# Patient Record
Sex: Female | Born: 1985 | Race: White | Hispanic: No | Marital: Married
Health system: Midwestern US, Academic
[De-identification: ages and names within clinical notes are randomized; demographics above are authoritative.]

---

## 2021-07-12 ENCOUNTER — Encounter: Admit: 2021-07-12 | Discharge: 2021-07-12

## 2021-07-21 ENCOUNTER — Encounter: Admit: 2021-07-21 | Discharge: 2021-07-21 | Payer: BC Managed Care – PPO | Primary: Primary Care

## 2021-07-25 ENCOUNTER — Encounter: Admit: 2021-07-25 | Discharge: 2021-07-25 | Payer: BC Managed Care – PPO | Primary: Primary Care

## 2021-07-25 DIAGNOSIS — Z9229 Personal history of other drug therapy: Secondary | ICD-10-CM

## 2021-07-28 ENCOUNTER — Encounter: Admit: 2021-07-28 | Discharge: 2021-07-28 | Payer: BC Managed Care – PPO | Primary: Primary Care

## 2021-07-28 ENCOUNTER — Ambulatory Visit: Admit: 2021-07-28 | Discharge: 2021-07-29 | Payer: BC Managed Care – PPO | Primary: Primary Care

## 2021-07-28 DIAGNOSIS — Z9229 Personal history of other drug therapy: Secondary | ICD-10-CM

## 2021-07-28 DIAGNOSIS — I82721 Chronic embolism and thrombosis of deep veins of right upper extremity: Secondary | ICD-10-CM

## 2021-07-28 DIAGNOSIS — G540 Brachial plexus disorders: Secondary | ICD-10-CM

## 2021-07-28 NOTE — Progress Notes
Date of Service: 07/28/2021    Anna Oneill is a 36 y.o. female.       Pertinent medical history:  1. Recurrent upper extremity deep vein thrombosis  2. Irritable bowel syndrome  3. Tobacco use    HPI     Anna Oneill is here today for evaluation of upper extremity DVT.  She was referred by her primary care provider Anna Piedra, NP.    She has a thrombosis history as detailed below: (We are unsure of the exact details as records are not available for review but details listed below are what patient recalls and from charts available)  1. November 2021: right arm dvt and PE. xarelto. 6 months then stopped.  She believes thrombophilia testing was completed at this time which was otherwise negative.  2. July 2022: Started to experience symptoms of right upper extremity pain.  Diagnosed with occlusive right upper extremity thrombosis and pulmonary embolism (?old). Restarted on anticoagulation with Xarelto at that time.  3. Repeat right upper extremity ultrasound July 2022 mentions an occlusive thrombus in the basilic vein.  Remaining veins are mentioned to demonstrate normal compressibility and waveforms.  4. October 2022: She started to experience symptoms of her new right upper extremity pain.  Ultrasound showed a partially occlusive right upper extremity subclavian DVT with evidence of collateralization with extension of the thrombus into the basilic and cephalic veins.  Partially occlusive left subclavian vein thrombosis.  No pulmonary embolism noted during this hospitalization.  This thrombotic event appears to have occurred while on Xarelto.  She was admitted in the hospital and started on anticoagulation with heparin and then DC'd on Pradaxa.    She reports symptoms of difficulty with raising her arms above her head due to pain.  She enjoys weight lifting/bodybuilding but has been unable to do so due to significant arm discomfort.  She also reports difficulty with drying her hair and with overhead movements.  She is a Nature conservation officer at [employer] and has had difficulty with her job due to the symptoms.  She is quite frustrated with her symptoms as they have become lifestyle limiting.    She was seen by Dr.Aboujawde with medical oncology at [facility] November 2022.  She was told to continue anticoagulation lifelong due to recurrent venous thrombosis.    She currently smokes cigarettes.    She does have a family history significant for a half brother that passed away from a pulmonary embolism.  She specifically called thrombophilia testing for genetic causes of thrombosis which was negative.  Otherwise no other known family history of thrombosis.         Vitals:    07/28/21 1107   BP: 114/80   BP Source: Leg, Left Upper   Pulse: 68   PainSc: Four   Weight: 81.4 kg (179 lb 6.4 oz)   Height: 170.2 cm (5' 7)     Body mass index is 28.1 kg/m?Marland Kitchen     Past Medical History  Patient Active Problem List    Diagnosis Date Noted   ? 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   HENT: Negative.  Eyes: Negative.    Cardiovascular: Positive for chest pain.   Respiratory: Negative.    Endocrine: Negative.    Hematologic/Lymphatic: Negative.    Skin: Negative.    Musculoskeletal: Negative.    Gastrointestinal: Negative.    Genitourinary: Negative.    Neurological: Negative.    Psychiatric/Behavioral: Negative.    Allergic/Immunologic: Negative.        Physical Exam   Nursing note and vitals reviewed.  Cardiovascular: Normal rate.   Dilated veins over right anterior chest wall   Musculoskeletal:      Cervical back: Normal range of motion and neck supple. No rigidity.      Comments: No significant upper extremity edema Neurological: She is alert and oriented to person, place, and time.   Skin: Skin is warm and dry.           Cardiovascular Studies      Cardiovascular Health Factors  Vitals BP Readings from Last 3 Encounters:   07/28/21 114/80     Wt Readings from Last 3 Encounters:   07/28/21 81.4 kg (179 lb 6.4 oz)     BMI Readings from Last 3 Encounters:   07/28/21 28.10 kg/m?      Smoking Social History     Tobacco Use   Smoking Status Every Day   ? Packs/day: 1.00   ? Types: Cigarettes   Smokeless Tobacco Not on file      Lipid Profile No results found for: CHOL  No results found for: HDL  No results found for: LDL  No results found for: TRIG   Blood Sugar No results found for: HGBA1C  No results found for: GLU, GLUF, GLUPOC       Problems Addressed Today  Encounter Diagnoses   Name Primary?   ? 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 TOS (thoracic outlet syndrome)        Assessment and Plan     [name] is here today for evaluation of upper extremity DVT.  She was first diagnosed with right upper extremity DVT and pulmonary embolism in July 2022.  She was started on anticoagulation with Xarelto.  In October 2022 she reported new symptoms of right upper extremity discomfort while compliant with Xarelto.  Ultrasound showed partially occlusive right upper extremity subclavian DVT with evidence of collateralization with extension of the thrombus into the basilic and cephalic veins and partially occlusive left subclavian vein thrombosis.  She was started on anticoagulation with Pradaxa due to concern for Xarelto failure.    Her symptoms and recurrent thrombosis in the subclavian-axillary distribution, despite adequate anticoagulation, is concerning for venous thoracic outlet syndrome.  Would recommend further evaluation with a venous ultrasound.  We will also place a referral to Dr. Naomie Dean for evaluation for possible thoracic outlet decompression.  We did discuss that she may require a venogram. Unsure that the recurrent thrombosis is related to Xarelto failure but more likely related to mechanical issues from thoracic outlet syndrome.  She is doing well on Pradaxa and can continue that for now.    Thrombophilia testing: She believes thrombophilia testing was completed during the time of her first diagnosis.  We will request these records from [facility] for review.    Continue anticoagulation with Pradaxa.         Current Medications (including today's revisions)  ? dabigatran (PRADAXA) 150 mg capsule Take one capsule by mouth twice daily.   ? dicyclomine (BENTYL) 20 mg  tablet Take one tablet by mouth four times daily as needed.   ? FLUoxetine (PROZAC) 40 mg capsule Take one capsule by mouth daily.   ? linaCLOtide (LINZESS) 145 mcg capsule Take one capsule by mouth daily 30 minutes before breakfast.   ? temazepam (RESTORIL) 30 mg capsule Take one capsule by mouth at bedtime as needed.

## 2021-07-28 NOTE — Patient Instructions
Thank you so much for seeing [place] today! Dr. Trixie Dredge would like for you to do the following,     A Referral has been sent to Dr Naomie Dean. His office will call you to schedule. Please make sure to schedule upper extremity ultrasound on same day (order has been placed and Dr Georgana Curio schedulers can schedule that also)  Follow up with Dr Trixie Dredge in 6 months    It was a pleasure to see you, Please send a Mychart message or call at [phone] if you have any questions. Thank you and have a great day!  It was good to see you today!     Please contact the cardiology [facility] Team at [phone] or send a message through the MyChart system if you have questions or concerns. We will get back to you as soon as possible.     NOTE: MyChart messages and phone calls received on weekends, on holidays, and after 4 pm on weekdays will NOT be seen until the following business day. If you have an urgent matter during these times, please call [phone] to reach the on-call team.     You may receive test results in MyChart before the ordering provider has reviewed them. Our care team will follow up with you after reviewing the tests to discuss your care. This may take up to 5-7 business days if results are not urgently needing to be addressed. Thank you for your patience.      If you need prescription refills, please contact your pharmacy.  Cardiology scheduling number: [phone]

## 2021-09-13 ENCOUNTER — Ambulatory Visit: Admit: 2021-09-13 | Discharge: 2021-09-13 | Payer: BC Managed Care – PPO | Primary: Primary Care

## 2021-09-13 ENCOUNTER — Encounter: Admit: 2021-09-13 | Discharge: 2021-09-13 | Payer: BC Managed Care – PPO | Primary: Primary Care

## 2021-09-13 DIAGNOSIS — Z9229 Personal history of other drug therapy: Secondary | ICD-10-CM

## 2021-09-13 DIAGNOSIS — G540 Brachial plexus disorders: Secondary | ICD-10-CM

## 2021-09-13 DIAGNOSIS — I82721 Chronic embolism and thrombosis of deep veins of right upper extremity: Secondary | ICD-10-CM

## 2021-09-13 DIAGNOSIS — I871 Compression of vein: Secondary | ICD-10-CM

## 2021-09-13 NOTE — Progress Notes
Date of Service: 09/13/2021              Chief Complaint   Patient presents with   ? Deep Vein Thrombosis       History of Present Illness    36 year old woman who is very much into weight lifting who couple years ago developed right upper extremity venous thrombosis.  She was placed on anticoagulation but did not get catheter directed lysis.    She subsequently stopped anticoagulation several months later and then redeveloped a DVT and has been on anticoagulation since.    She has no hypercoagulable history /reports she had a negative work-up in the past with one of her previous DVTs.    Recent duplex suggest partially plosive subclavian vein chronic thrombus.          Medical History:   Diagnosis Date   ? HX: anticoagulation        History reviewed. No pertinent surgical history.    Allergies:  Allergies   Allergen Reactions   ? Promethazine RASH       Medication List:  ? dabigatran (PRADAXA) 150 mg capsule Take one capsule by mouth twice daily.   ? dicyclomine (BENTYL) 20 mg tablet Take one tablet by mouth four times daily as needed.   ? FLUoxetine (PROZAC) 40 mg capsule Take one capsule by mouth daily.   ? linaCLOtide (LINZESS) 145 mcg capsule Take one capsule by mouth daily 30 minutes before breakfast.   ? temazepam (RESTORIL) 30 mg capsule Take one capsule by mouth at bedtime as needed.       Social History:   reports that she has been smoking cigarettes. She has been smoking an average of 1 pack per day. She does not have any smokeless tobacco history on file.    History reviewed. No pertinent family history.    Review of Systems            Vitals:    09/13/21 1424 09/13/21 1426   BP: 124/78 116/76   BP Source: Arm, Right Upper Arm, Left Upper   Pulse: 75 71   Weight: 81.2 kg (179 lb)    Height: 170.2 cm (5' 7)      Body mass index is 28.04 kg/m?Marland Kitchen     Physical Exam  Constitutional:       Appearance: Normal appearance.   HENT:      Head: Normocephalic.      Mouth/Throat:      Pharynx: Oropharynx is clear. Eyes:      Conjunctiva/sclera: Conjunctivae normal.   Cardiovascular:      Rate and Rhythm: Normal rate and regular rhythm.      Heart sounds: Normal heart sounds.      Comments: Loss of right radial pulse with abduction and posterior deflection of the right arm  Pulmonary:      Breath sounds: Normal breath sounds.   Abdominal:      Palpations: Abdomen is soft. There is no mass.   Musculoskeletal:         General: Normal range of motion.      Cervical back: Normal range of motion and neck supple.   Skin:     General: Skin is warm.   Neurological:      General: No focal deficit present.      Mental Status: She is alert. Mental status is at baseline.   Psychiatric:         Mood and Affect: Mood normal.  Thought Content: Thought content normal.         Judgment: Judgment normal.             Assessment and Plan:    1. 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    We discussed the risks benefits alternatives.  I did discuss with her that I doubt her arm will be completely normal completely asymptomatic from handout but studies would suggest that functionally she should improve and with venous thrombectomy and thoracic outlet decompression we could likely take her off coagulation anticoagulation a few months after the surgery.  She wants to proceed with right first rib resection and subclavian vein repair

## 2021-09-14 ENCOUNTER — Encounter: Admit: 2021-09-14 | Discharge: 2021-09-14 | Payer: BC Managed Care – PPO | Primary: Primary Care

## 2021-09-14 ENCOUNTER — Inpatient Hospital Stay: Admit: 2021-09-14 | Discharge: 2021-09-14 | Payer: BC Managed Care – PPO | Primary: Primary Care

## 2021-09-14 DIAGNOSIS — I871 Compression of vein: Secondary | ICD-10-CM

## 2021-09-14 MED ORDER — CEFAZOLIN IN 0.9% SOD CHLORIDE 2 GRAM/110 ML IVPB
2 g | Freq: Once | INTRAVENOUS | 0 refills
Start: 2021-09-14 — End: ?

## 2021-09-15 ENCOUNTER — Encounter: Admit: 2021-09-15 | Discharge: 2021-09-15 | Payer: BC Managed Care – PPO | Primary: Primary Care

## 2021-09-18 ENCOUNTER — Encounter: Admit: 2021-09-18 | Discharge: 2021-09-18 | Payer: BC Managed Care – PPO | Primary: Primary Care

## 2021-09-22 ENCOUNTER — Encounter: Admit: 2021-09-22 | Discharge: 2021-09-22 | Payer: BC Managed Care – PPO | Primary: Primary Care

## 2021-09-22 ENCOUNTER — Ambulatory Visit: Admit: 2021-09-22 | Discharge: 2021-09-22 | Payer: BC Managed Care – PPO | Primary: Primary Care

## 2021-09-22 DIAGNOSIS — I871 Compression of vein: Secondary | ICD-10-CM

## 2021-09-22 DIAGNOSIS — Z9229 Personal history of other drug therapy: Secondary | ICD-10-CM

## 2021-09-22 DIAGNOSIS — Z01818 Encounter for other preprocedural examination: Secondary | ICD-10-CM

## 2021-09-22 DIAGNOSIS — K589 Irritable bowel syndrome without diarrhea: Secondary | ICD-10-CM

## 2021-09-22 DIAGNOSIS — I82721 Chronic embolism and thrombosis of deep veins of right upper extremity: Secondary | ICD-10-CM

## 2021-09-22 LAB — COMPREHENSIVE METABOLIC PANEL
ALBUMIN: 4.6 g/dL (ref 3.5–5.0)
ALK PHOSPHATASE: 51 U/L (ref 25–110)
ALT: 51 U/L (ref 7–56)
ANION GAP: 7 (ref 3–12)
AST: 53 U/L — ABNORMAL HIGH (ref 7–40)
BLD UREA NITROGEN: 16 mg/dL (ref 7–25)
CALCIUM: 9.7 mg/dL (ref 8.5–10.6)
CHLORIDE: 101 MMOL/L (ref 98–110)
CO2: 28 MMOL/L (ref 21–30)
CREATININE: 1 mg/dL — ABNORMAL HIGH (ref 0.4–1.00)
EGFR: >60 mL/min (ref >60)
GLUCOSE,PANEL: 91 mg/dL (ref 70–100)
POTASSIUM: 4.5 MMOL/L (ref 3.5–5.1)
SODIUM: 136 MMOL/L — ABNORMAL LOW (ref 137–147)
TOTAL BILIRUBIN: 1.1 mg/dL (ref 0.3–1.2)
TOTAL PROTEIN: 7.8 g/dL (ref 6.0–8.0)

## 2021-09-22 NOTE — Pre-Anesthesia Patient Instructions
PREPROCEDURE INFORMATION    Arrival at the hospital  [facility]  [address]  [place], [place] [address]    Park in the [employer], located directly across from the main entrance to the hospital.  Enter through the ground floor main hospital entrance and check in at the Information Desk in the lobby.  They will validate your parking ticket and direct you to the next location.  If you are a woman between the ages of 96 and 63, and have not had a hysterectomy, you will be asked for a urine sample prior to surgery.  Please do not urinate before arriving in the Surgery Waiting Room.  Once there, check in and let the attendant know if you need to provide a sample.    You will receive a call with your surgery arrival time between 2:30pm and 4:30pm the last business day before your procedure.  If you do not receive a call, please call [phone] before 4:30pm or [phone] after 4:30pm.  Phone carriers that use spam blockers will sometimes block our phone numbers. If your phone contact number is a mobile phone, please adjust your settings to make sure you receive our call.  In your phone settings, turn OFF the setting ?silence unknown callers.?  Please add these phone numbers to your contacts [phone] & [phone]  Follow any additional instructions from Dr Decamp's office    Eating or drinking before surgery  Do not eat or drink anything after 11:00 p.m. the day before your procedure (including gum, mints, candy, or chewing tobacco) OR follow the specific instructions you were given by your Surgeon.  You may have WATER ONLY up to 2 hours before arriving at the hospital.    Planning transportation for outpatient procedure  For your safety, you will need to arrange for a responsible ride/person to accompany you home due to sedation or anesthesia with your procedure.  An [employer] Goad, taxi or other public transportation driver is not considered a responsible person to accompany you home.    Bath/Shower Instructions  Take a bath or shower using the special soap given to you in PAC. Use half the bottle the night before, and the other half the morning of your procedure. Use clean towels with each bath or shower.  Put on clean clothes after bath or shower.  Avoid using lotion and oils.  If you are having surgery above the waist, wear a shirt that fastens up the front.  Sleep on clean sheets if bath or shower is done the night before procedure.    Morning of your procedure:  Brush your teeth and tongue  Do not smoke, vape, chew or user any tobacco products.  Do not shave the area where you will have surgery.  Remove nail polish, makeup and all jewelry (including piercings) before coming to the hospital.  Dress in clean, loose, comfortable clothing.    Valuables  Leave money, credit cards, jewelry, and any other valuables at home. The [facility] [place] [facility] is not responsible for the loss or breakage of personal items.    What to bring to the hospital  ID/Insurance card  Medical Device card  Official documents for legal guardianship  Copy of your Living Will, Advanced Directives, and/or Durable Power of Attorney. If you have these documents, please bring them to the admissions office on the day of your surgery to be scanned into your records.  Do not bring medications from home unless instructed by a pharmacist.  CPAP/BiPAP machine (including all supplies)  Walker, cane, or motorized scooter  Cases for glasses/hearing aids/contact lens (bring solutions for contacts)  Stimulator remote     Notify us at [facility]: [phone] on the day of your procedure if:  You need to cancel your procedure.  You are going to be late.    Notify your surgeon if:  There is a possibility that you are pregnant.  You become ill with a cough, fever, sore throat, nausea, vomiting or flu-like symptoms.  You have any open wounds/sores that are red, painful, draining, or are new since you last saw the doctor.  You need to cancel your procedure.    Preparing to get your medications at discharge  Your surgeon may prescribe you medications to take after your procedure.  If you like the convenience of having your medications filled here at [facility], please do the following:  Go to [facility] pharmacy after your [facility] appointment to put a credit card on file.  Call [place] pharmacy at [phone] (Monday-Friday 7am-9pm or Saturday and Sunday 9am-5pm) to put a credit card on file.  Bring a credit card or cash on the day of your procedure- please leave with a family member rather than bringing it into the preop area.    Current Visitor Policy:  Visitors must be free of fever and symptoms to be in our facilities.  No more than 2 visitors per patient are allowed.  Additional guidelines may vary, based on patient care area or patient's condition.  Patients in semiprivate rooms may have visitors, but visits should be coordinated so only two total visitors are in a room at a time due to space limitations.  Children younger than age 54 are allowed to visit inpatients.    Thank you for participating in your Preoperative Assessment Clinic visit today.    If you have any changes to your health or hospitalizations between now and your surgery, please call us at [phone] for Main instructions.     Pre-Surgery Shower    Bathe with chlorhexidine gluconate (CHG) liquid soap only of told to do so by a healthcare provider.       Shower with this soap the DAY BEFORE and the MORNING OF your surgery.  You should use 1/2 of the bottle with each shower.  This mild soap reduces the amount of germs on your skin that could cause infection.  Do not use chlorhexidine soap if you are allergic to this product.  Instead, you should use Dial anti-bacterial soap.  Do not apply the chlorhexidine soap to any open wound.           Shampoo your hair, wash your face, and clean your genital area with the soap you normally use.                                 2. From the neck down, apply the chlorhexidine soap with a clean washcloth.  Do not apply this soap near eyes, ears, or on genital area. This product can cause blindness or hearing loss if used on eyes or ears.    3. Do not shave surgical area.    4. Turn off water to prevent rinsing the chlorhexidine soap off too soon.  Wash your body gently for 2 minutes.  Pay special attention to the area where you will have your surgery.  Do not wash with your regular soap  after the chlorhexidine soap is used.    5. After this soap has been on your skin for 2 minutes, rinse your body thoroughly.        6.  Pat yourself dry with a clean, soft towel.    7. Do not apply products such as lotion, oil, deodorant, powder or perfume/aftershave on your hair, face or skin after you shower.        8. Put on freshly laundered clothes and sleep on freshly laundered linens.    9.  Wear freshly laundered clothes to the hospital the day of surgery.    You can purchase the liquid chlorhexidine soap over the counter at most large drugstores including [facility], [facility] and [facility].  A prescription is not needed, but you may ask your pharmacist for assistance locating it in the store.  A common brand name for this soap is Hibiclens, but any brand is acceptable to use.

## 2021-10-17 ENCOUNTER — Encounter: Admit: 2021-10-17 | Discharge: 2021-10-17 | Payer: BC Managed Care – PPO | Primary: Primary Care

## 2021-10-27 ENCOUNTER — Encounter: Admit: 2021-10-27 | Discharge: 2021-10-27 | Payer: BC Managed Care – PPO | Primary: Primary Care

## 2021-10-27 ENCOUNTER — Inpatient Hospital Stay: Admit: 2021-10-27 | Discharge: 2021-10-27 | Payer: BC Managed Care – PPO | Primary: Primary Care

## 2021-10-27 DIAGNOSIS — K589 Irritable bowel syndrome without diarrhea: Secondary | ICD-10-CM

## 2021-10-27 DIAGNOSIS — Z9229 Personal history of other drug therapy: Secondary | ICD-10-CM

## 2021-10-27 MED ORDER — EPHEDRINE SULFATE 50 MG/ML IV SOLN
INTRAVENOUS | 0 refills | Status: DC
Start: 2021-10-27 — End: 2021-10-27
  Administered 2021-10-27 (×2): 10 mg via INTRAVENOUS
  Administered 2021-10-27 (×3): 5 mg via INTRAVENOUS

## 2021-10-27 MED ORDER — PROPOFOL INJ 10 MG/ML IV VIAL
INTRAVENOUS | 0 refills | Status: DC
Start: 2021-10-27 — End: 2021-10-27
  Administered 2021-10-27: 17:00:00 150 mg via INTRAVENOUS
  Administered 2021-10-27 (×2): 50 mg via INTRAVENOUS

## 2021-10-27 MED ORDER — DEXAMETHASONE SODIUM PHOSPHATE 4 MG/ML IJ SOLN
INTRAVENOUS | 0 refills | Status: DC
Start: 2021-10-27 — End: 2021-10-27
  Administered 2021-10-27: 17:00:00 4 mg via INTRAVENOUS

## 2021-10-27 MED ORDER — FENTANYL CITRATE (PF) 50 MCG/ML IJ SOLN
INTRAVENOUS | 0 refills | Status: DC
Start: 2021-10-27 — End: 2021-10-27
  Administered 2021-10-27 (×2): 50 ug via INTRAVENOUS

## 2021-10-27 MED ORDER — SUCCINYLCHOLINE CHLORIDE 20 MG/ML IJ SOLN
INTRAVENOUS | 0 refills | Status: DC
Start: 2021-10-27 — End: 2021-10-27
  Administered 2021-10-27: 17:00:00 180 mg via INTRAVENOUS

## 2021-10-27 MED ORDER — KETOROLAC 30 MG/ML (1 ML) IJ SOLN
INTRAVENOUS | 0 refills | Status: DC
Start: 2021-10-27 — End: 2021-10-27
  Administered 2021-10-27: 17:00:00 30 mg via INTRAVENOUS

## 2021-10-27 MED ORDER — ACETAMINOPHEN 1,000 MG/100 ML (10 MG/ML) IV SOLN
INTRAVENOUS | 0 refills | Status: DC
Start: 2021-10-27 — End: 2021-10-27
  Administered 2021-10-27: 19:00:00 1000 mg via INTRAVENOUS

## 2021-10-27 MED ORDER — HYDROMORPHONE (PF) 2 MG/ML IJ SYRG
INTRAVENOUS | 0 refills | Status: DC
Start: 2021-10-27 — End: 2021-10-27
  Administered 2021-10-27: 20:00:00 .5 mg via INTRAVENOUS

## 2021-10-27 MED ORDER — ARTIFICIAL TEARS (PF) SINGLE DOSE DROPS GROUP
OPHTHALMIC | 0 refills | Status: DC
Start: 2021-10-27 — End: 2021-10-27
  Administered 2021-10-27: 17:00:00 1 [drp] via OPHTHALMIC

## 2021-10-27 MED ORDER — ROCURONIUM 10 MG/ML IV SOLN
INTRAVENOUS | 0 refills | Status: DC
Start: 2021-10-27 — End: 2021-10-27
  Administered 2021-10-27: 17:00:00 5 mg via INTRAVENOUS

## 2021-10-27 MED ORDER — ONDANSETRON HCL (PF) 4 MG/2 ML IJ SOLN
INTRAVENOUS | 0 refills | Status: DC
Start: 2021-10-27 — End: 2021-10-27
  Administered 2021-10-27: 20:00:00 4 mg via INTRAVENOUS

## 2021-10-27 MED ORDER — MIDAZOLAM 1 MG/ML IJ SOLN
INTRAVENOUS | 0 refills | Status: DC
Start: 2021-10-27 — End: 2021-10-27
  Administered 2021-10-27: 17:00:00 2 mg via INTRAVENOUS

## 2021-10-27 MED ORDER — LIDOCAINE (PF) 200 MG/10 ML (2 %) IJ SYRG
INTRAVENOUS | 0 refills | Status: DC
Start: 2021-10-27 — End: 2021-10-27
  Administered 2021-10-27: 17:00:00 50 mg via INTRAVENOUS
  Administered 2021-10-27: 20:00:00 80 mg via INTRAVENOUS

## 2021-10-27 MED ADMIN — LACTATED RINGERS IV SOLP [4318]: 1000 mL | INTRAVENOUS | @ 16:00:00 | Stop: 2021-10-27 | NDC 00338011704

## 2021-10-27 MED ADMIN — HEPARIN (PORCINE) 1,000 UNIT/ML IJ SOLN [10176]: 500 mL | @ 18:00:00 | Stop: 2021-10-27 | NDC 63323054011

## 2021-10-27 MED ADMIN — LACTATED RINGERS IV SOLP [4318]: 1000.000 mL | INTRAVENOUS | @ 19:00:00 | Stop: 2021-10-29 | NDC 00338011704

## 2021-10-27 MED ADMIN — LIDOCAINE HCL 10 MG/ML (1 %) IJ SOLN [4452]: 9 mL | INTRAMUSCULAR | @ 18:00:00 | Stop: 2021-10-27 | NDC 00409427617

## 2021-10-27 MED ADMIN — OXYCODONE 5 MG PO TAB [10814]: 5 mg | ORAL | @ 23:00:00 | NDC 10702001801

## 2021-10-27 MED ADMIN — CEFAZOLIN 1 GRAM IJ SOLR [1445]: 1000 mL | @ 18:00:00 | Stop: 2021-10-27 | NDC 00143992490

## 2021-10-27 MED ADMIN — CEFAZOLIN INJ 1GM IVP [210319]: 2 g | INTRAVENOUS | @ 17:00:00 | Stop: 2021-10-27 | NDC 00143992490

## 2021-10-27 MED ADMIN — SODIUM CHLORIDE 0.9 % IR SOLN [11403]: 1000 mL | @ 18:00:00 | Stop: 2021-10-27 | NDC 00338004804

## 2021-10-27 MED ADMIN — OXYCODONE 5 MG PO TAB [10814]: 10 mg | ORAL | @ 21:00:00 | Stop: 2021-10-27 | NDC 00406055223

## 2021-10-27 MED ADMIN — HYDROMORPHONE (PF) 2 MG/ML IJ SYRG [163476]: 0.5 mg | INTRAVENOUS | @ 22:00:00 | Stop: 2021-10-27 | NDC 00409131203

## 2021-10-27 MED ADMIN — HYDROMORPHONE (PF) 2 MG/ML IJ SYRG [163476]: 0.5 mg | INTRAVENOUS | @ 21:00:00 | Stop: 2021-10-27 | NDC 00409131203

## 2021-10-27 MED ADMIN — GABAPENTIN 300 MG PO CAP [18308]: 300 mg | ORAL | @ 23:00:00 | NDC 67877022305

## 2021-10-27 MED ADMIN — LACTATED RINGERS IV SOLP [4318]: 500 mL | @ 18:00:00 | Stop: 2021-10-27 | NDC 00338011703

## 2021-10-27 MED ADMIN — FENTANYL CITRATE (PF) 50 MCG/ML IJ SOLN [3037]: 50 ug | INTRAVENOUS | @ 20:00:00 | Stop: 2021-10-27 | NDC 00641602701

## 2021-10-27 MED ADMIN — FENTANYL CITRATE (PF) 50 MCG/ML IJ SOLN [3037]: 50 ug | INTRAVENOUS | @ 21:00:00 | Stop: 2021-10-27 | NDC 00641602701

## 2021-10-27 MED ADMIN — FLUOXETINE 20 MG PO CAP [10070]: 40 mg | ORAL | @ 23:00:00 | NDC 00904578561

## 2021-10-28 ENCOUNTER — Inpatient Hospital Stay: Admit: 2021-10-28 | Discharge: 2021-10-28 | Payer: BC Managed Care – PPO | Primary: Primary Care

## 2021-10-28 ENCOUNTER — Encounter: Admit: 2021-10-28 | Discharge: 2021-10-28 | Payer: BC Managed Care – PPO | Primary: Primary Care

## 2021-10-28 MED ADMIN — OXYCODONE 5 MG PO TAB [10814]: 10 mg | ORAL | @ 07:00:00 | Stop: 2021-10-28 | NDC 10702001801

## 2021-10-28 MED ADMIN — POLYETHYLENE GLYCOL 3350 17 GRAM PO PWPK [25424]: 17 g | ORAL | @ 02:00:00 | NDC 00904693186

## 2021-10-28 MED ADMIN — HEPARIN (PORCINE) IN 5 % DEX 20,000 UNIT/500 ML (40 UNIT/ML) IV SOLP [3628]: 730 [IU]/h | INTRAVENOUS | @ 02:00:00 | NDC 00264956710

## 2021-10-28 MED ADMIN — SENNOSIDES-DOCUSATE SODIUM 8.6-50 MG PO TAB [40926]: 1 | ORAL | @ 02:00:00 | NDC 00536124801

## 2021-10-28 MED ADMIN — FENTANYL CITRATE (PF) 50 MCG/ML IJ SOLN [3037]: 25 ug | INTRAVENOUS | @ 10:00:00 | Stop: 2021-10-28 | NDC 00409909412

## 2021-10-28 MED ADMIN — GABAPENTIN 300 MG PO CAP [18308]: 300 mg | ORAL | @ 10:00:00 | Stop: 2021-10-28 | NDC 67877022305

## 2021-10-28 MED ADMIN — ACETAMINOPHEN 500 MG PO TAB [102]: 1000 mg | ORAL | @ 02:00:00 | NDC 00904673061

## 2021-10-28 MED ADMIN — EUCALYPTUS-MENTHOL MM LOZG [83613]: 1 | ORAL | @ 09:00:00 | Stop: 2021-10-28 | NDC 12546062970

## 2021-10-28 MED ADMIN — OXYCODONE 5 MG PO TAB [10814]: 10 mg | ORAL | @ 13:00:00 | Stop: 2021-10-28 | NDC 00406055223

## 2021-10-28 MED ADMIN — OXYCODONE 5 MG PO TAB [10814]: 5 mg | ORAL | @ 02:00:00 | NDC 10702001801

## 2021-10-28 MED ADMIN — FENTANYL CITRATE (PF) 50 MCG/ML IJ SOLN [3037]: 25 ug | INTRAVENOUS | @ 08:00:00 | Stop: 2021-10-28 | NDC 00409909412

## 2021-10-28 MED ADMIN — ACETAMINOPHEN 500 MG PO TAB [102]: 1000 mg | ORAL | @ 10:00:00 | Stop: 2021-10-28 | NDC 00904673061

## 2021-10-28 MED ADMIN — MELATONIN 5 MG PO TAB [168576]: 5 mg | ORAL | @ 03:00:00 | NDC 77333052025

## 2021-10-28 MED ADMIN — FLUOXETINE 20 MG PO CAP [10070]: 40 mg | ORAL | @ 13:00:00 | Stop: 2021-10-28 | NDC 00904578561

## 2021-10-29 ENCOUNTER — Encounter: Admit: 2021-10-29 | Discharge: 2021-10-29 | Payer: BC Managed Care – PPO | Primary: Primary Care

## 2021-10-29 DIAGNOSIS — K589 Irritable bowel syndrome without diarrhea: Secondary | ICD-10-CM

## 2021-10-29 DIAGNOSIS — Z9229 Personal history of other drug therapy: Secondary | ICD-10-CM

## 2021-10-31 ENCOUNTER — Encounter: Admit: 2021-10-31 | Discharge: 2021-10-31 | Payer: BC Managed Care – PPO | Primary: Primary Care

## 2021-10-31 MED ORDER — OXYCODONE 5 MG PO TAB
5-10 mg | ORAL_TABLET | ORAL | 0 refills | 6.00000 days | Status: AC | PRN
Start: 2021-10-31 — End: ?

## 2021-10-31 NOTE — Telephone Encounter
Reviewed refill request with Bryson Dames ARNP, V.O. ok for refill of oxycodone, Remind her that she should be weaning off. She can add Tylenol 325 mg per 5 mg oxycodone that she is taking (1-2 q 6h). I reviewed this information with the patient and she voiced understanding. No further concerns voiced at this time.

## 2021-10-31 NOTE — Telephone Encounter
Who is calling? Anna Oneill    What is the patients need/concern? Patient called and stated that she is needing her pain medication refilled.  Patient stated that she was only given 4 pills for her pain.  Please reach back out to the patient to further discuss/assist.  Thank you.      Which hospital was the patient seen at? [facility]    Are there any other needs/concerns the patient has?  no   If yes, description of concern:    Patient was notified that if call was received after 3:30 pm the call may not be returned until the next business day. Yes    Read back to patient before sending: yes    Call Back phone number: [phone]

## 2021-11-15 ENCOUNTER — Ambulatory Visit: Admit: 2021-11-15 | Discharge: 2021-11-16 | Payer: BC Managed Care – PPO | Primary: Primary Care

## 2021-11-15 ENCOUNTER — Encounter: Admit: 2021-11-15 | Discharge: 2021-11-15 | Payer: BC Managed Care – PPO | Primary: Primary Care

## 2021-11-15 DIAGNOSIS — Z9229 Personal history of other drug therapy: Secondary | ICD-10-CM

## 2021-11-15 DIAGNOSIS — I871 Compression of vein: Secondary | ICD-10-CM

## 2021-11-15 DIAGNOSIS — I82721 Chronic embolism and thrombosis of deep veins of right upper extremity: Secondary | ICD-10-CM

## 2021-11-15 DIAGNOSIS — K589 Irritable bowel syndrome without diarrhea: Secondary | ICD-10-CM

## 2021-11-15 NOTE — Patient Instructions
Continue taking all medications as prescribed. 3-6 months on the blood thinner then we will get an ultrasound and will decide if your blood thinner can be stopped.     Keep the smaller open spot covered with a small bandaid and antibiotic ointment during the day and remove bandaid at night to allow it to get air.     [facility]  If antibiotics have been prescribed, take them exactly as directed. Don't t stop taking them until they are gone or you are told to stop, even if you feel better.   Follow the healthcare provider's instructions on how to care for the incision and if you should change the dressing or leave it in place.  Until your incision is healed and looks like a scar DO NOT submerge/soak your incision site. Avoid swimming and hot tubs. Avoid baths that allow your incision to soak.  Don't scratch, rub, or pick at the area.  Apply sunscreen after scar develops to avoid hyperpigmentation (darkening of color)       Treatment for Thoracic Outlet Syndrome  Thoracic outlet syndrome (TOS) is a set of symptoms in the shoulder, arm, or hand. It occurs from a narrowing of the thoracic outlet. This is the space between your collarbone and your first rib. It can result from injury, disease, or a problem present from birth. Thoracic outlet syndrome is not common. It can occur in people of any age. Treatment for thoracic outlet syndrome depends on whether the nerves, arteries, or veins are compressed.   Types of treatment  Treatments for thoracic outlet syndrome may be nonsurgical or surgical, and may include:   Physical therapy to help strengthen shoulder muscles, improve posture, and enlarge the thoracic outlet space  Over-the-counter pain medicine to relieve pain and swelling  Loss of excess weight  Changes to daily activities that bring on symptoms  Botulinum toxin shots (injections)  Surgical decompression, which may be removing a part of a muscle (anterior scalene), the first rib, or a fibrous band  Removing blood clots if veins become blocked  Possible complications of thoracic outlet syndrome  If the brachial plexus is pinched because of TOS (neurogenic TOS), you can have weakness in the hand and wrist. You may also have numbness in the small finger. In cases of TOS that pinch the veins, a blood clot can form in a vein in your arm. This blocks blood flow and may make your arm very swollen. The clot may also move to the lungs and cause a pulmonary embolism. This is a blood clot in an artery in your lung. Or a clot can move somewhere else in your body. You may need to take blood-thinner medicine to prevent clotting. You may need a procedure to remove the clot using a thin tube (catheter) inserted through a vein.   A blood clot may also form in one of the arteries of your arm. This may cause a sudden decrease in blood flow to your arm. The clot may be treated with blood thinners or a catheter inserted through an artery. In some cases, surgery may be done to remove the clot.   Living with thoracic outlet syndrome  There are things you can do to help prevent symptoms. Don?t put heavy bags over your shoulder. This increases pressure on the thoracic outlet. Also, practice your physical therapy exercises. This will help keep your shoulder muscles strong.   You may have a poor response to nonsurgical treatment and a good response to  Botulinum toxin shots. In that case, you may be a good candidate for surgical decompression.   When to call your healthcare provider  Call your healthcare provider right away if you have any of these:  Arm or hand that is suddenly cool, lighter in color, or swollen  Sudden weakness of your hand  Symptoms that don?t get better with therapy  [employer] last reviewed this educational content on 02/16/2021  ? 2000-2023 [employer], [place]. All rights reserved. This information is not intended as a substitute for professional medical care. Always follow your healthcare professional's instructions.

## 2021-11-15 NOTE — Progress Notes
Date of Service: 11/15/2021              Chief Complaint   Patient presents with   ? Wound Check       History of Present Illness    36 year old woman first postoperative visit for right first rib resection and scalenectomy with a subclavian vein thrombectomy/vein patch angioplasty via infraclavicular approach  She states that her arm and hand symptoms are already much improved.    Her incision is healing nicely.  She has no pain, paresthesias, weakness in her hand no shortness of breath.    Her postoperative chest x-ray looked good.    Overall she is doing well slowly return to normal activity    Continue anticoagulation for now we will see her back in 3 months and get right extremity venous duplex and at that point determine duration of anticoagulation.      Medical History:   Diagnosis Date   ? HX: anticoagulation    ? IBS (irritable bowel syndrome)        Surgical History:   Procedure Laterality Date   ? RIGHT FIRST RIB RESECTION WITH VENOUS THROMBECTOMY AND VEIN PATCH ANGIOPLASTY Right 10/27/2021    Performed by Hilton Sinclair, MD at [facility] OR   ? HX APPENDECTOMY     ? HX BREAST AUGMENTATION     ? HX HAND SURGERY Left    ? SHOULDER SURGERY Right     labrum repair       Allergies:  Allergies   Allergen Reactions   ? Promethazine RASH       Medication List:  ? dabigatran (PRADAXA) 150 mg capsule Take one capsule by mouth twice daily.   ? dicyclomine (BENTYL) 20 mg tablet Take one tablet by mouth daily.   ? FLUoxetine (PROZAC) 40 mg capsule Take one capsule by mouth daily.   ? L.acid/L.casei/B.bif/B.lon/FOS (PROBIOTIC BLEND PO) Take  by mouth daily.   ? temazepam (RESTORIL) 30 mg capsule Take one capsule by mouth at bedtime as needed.       Social History:   reports that she has been smoking cigarettes. She has a 10.00 pack-year smoking history. She uses smokeless tobacco. She reports current alcohol use of about 5.0 - 8.0 standard drinks of alcohol per week. She reports that she does not currently use drugs.    History reviewed. No pertinent family history.    Review of Systems            Vitals:    11/15/21 1052   BP: 116/78   BP Source: Arm, Left Upper   Pulse: 59   PainSc: Zero   Weight: 70.3 kg (155 lb)   Height: 170.2 cm (5' 7)     Body mass index is 24.28 kg/m?Marland Kitchen     Physical Exam        Assessment and Plan:    No diagnosis found.

## 2021-11-16 DIAGNOSIS — Z7901 Long term (current) use of anticoagulants: Secondary | ICD-10-CM

## 2022-02-07 ENCOUNTER — Encounter: Admit: 2022-02-07 | Discharge: 2022-02-07 | Payer: BC Managed Care – PPO | Primary: Primary Care

## 2022-02-21 ENCOUNTER — Encounter: Admit: 2022-02-21 | Discharge: 2022-02-21 | Payer: BC Managed Care – PPO | Primary: Primary Care

## 2022-03-01 ENCOUNTER — Encounter: Admit: 2022-03-01 | Discharge: 2022-03-01 | Payer: BC Managed Care – PPO | Primary: Primary Care

## 2022-04-18 ENCOUNTER — Encounter: Admit: 2022-04-18 | Discharge: 2022-04-18 | Payer: No Typology Code available for payment source | Primary: Primary Care

## 2022-04-18 NOTE — Telephone Encounter
I reviewed this information with Dr. Donzetta Matters team, V.O. ok to hold on the Xarelto since out till after appointment then ok to work into my clinic next week. Please ask her if she having any arm swelling issues. I reviewed the above information with the patient and she denies swelling issues. Assisted to move up her appointment and the patient is aware all her appointments are at the [facility] clinic. She did clarify she has probably been off the Xarelto for the last month and cancelled her appointment on 02/21/2022 as she started a new job and could not miss work.  I reviewed ER precautions for increased right arm swelling that become continuously hard, red, warm any arm pain that become continuous and did not resolve or issues with difficulties using her right arm normally  , if her right arm or hand become pale or bluish in color or very cool to touch.

## 2022-04-18 NOTE — Telephone Encounter
I spoke with the patient who reviewed that she as been out of her Xarelto since 02/2022 she was schedule for follow up then but she cancelled in error and how getting rescheduled. She also reports intermittent daily episodes of throbbing pains to her right shoulder and upper collar bone area. The only last a few minutes then resolve , nothing brings them they are intermittent even when she just sitting at rest. Denies any pains that are affecting her ability to rest. She reports normal color and temperature down her right arm and hand and no issues using her right arm/hand. She also states that the vein in her right clavicle appears to stick out. I offered her the first available appointment to get the DVT duplex then reviewed I would discuss these concerns with Dr. Harvie Heck team and follow up on the Xarelto and a sooner office visit.

## 2022-04-18 NOTE — Telephone Encounter
Who is calling? Anna Oneill     What is the patients need/concern? Patient called to reschedule canceled appointment.  However, patient stated that she had a 3 month supply of blood thinners and has taken them all.  Patient stated that she has been having pain in her right arm (shoulder area) since the surgery and is wanting to know if this is something she should be concerned about.  Patient stated that the vein is "sticking out."  Please reach back out to the patient to further discuss/assist.  Patient was rescheduled at the first availability  Thank you.  .        Which hospital was the patient seen at? [facility]    Are there any other needs/concerns the patient has?  no   If yes, description of concern:    Patient was notified that if call was received after 3:30 pm the call may not be returned until the next business day. Yes    Read back to patient before sending: yes    Call Back phone number: [phone]

## 2022-04-18 NOTE — Telephone Encounter
Patient has been made aware that there is a significant amount of time between scheduled ultrasound and office visit. They have agreed to be scheduled this way. FYI.

## 2022-04-20 ENCOUNTER — Encounter: Admit: 2022-04-20 | Discharge: 2022-04-20 | Payer: No Typology Code available for payment source | Primary: Primary Care

## 2022-04-20 ENCOUNTER — Ambulatory Visit: Admit: 2022-04-20 | Discharge: 2022-04-20 | Payer: No Typology Code available for payment source | Primary: Primary Care

## 2022-04-20 DIAGNOSIS — I82721 Chronic embolism and thrombosis of deep veins of right upper extremity: Secondary | ICD-10-CM

## 2022-04-20 DIAGNOSIS — I871 Compression of vein: Secondary | ICD-10-CM

## 2022-04-25 ENCOUNTER — Encounter: Admit: 2022-04-25 | Discharge: 2022-04-25 | Payer: No Typology Code available for payment source | Primary: Primary Care

## 2022-05-30 ENCOUNTER — Encounter: Admit: 2022-05-30 | Discharge: 2022-05-30 | Payer: No Typology Code available for payment source | Primary: Primary Care

## 2022-07-24 ENCOUNTER — Encounter: Admit: 2022-07-24 | Discharge: 2022-07-24 | Payer: No Typology Code available for payment source | Primary: Primary Care

## 2024-01-18 IMAGING — CT STONE PROTOCOL(Adult)
2 of 3 series · 13 of 46 positions shown, 15 images · non-contrast
Comparison: none

PROCEDURE: STONE PROTOCOL(Adult)
HISTORY: HEMATURIA, DYSURIA.

PRELIMINARY REPORT
TECHNIQUE: Axial CT imaging of the abdomen and pelvis was performed without IV
contrast.
This exam was performed using one or more the following dose reduction techniques:
Automated exposure control, adjustment of the mA and/or KV according to the patient's
size or use of iterative reconstruction technique. Total DLP dose measures x mGy with a
total CTDI dose measuring x mGy.

[Series 2: abdomen ax 2.00 br40 s3 · axial · 0.55mm/px · z∈[+1301,+1689]mm · 10 of 224 slices shown, 12 images]
[im 15/224  soft-tissue]
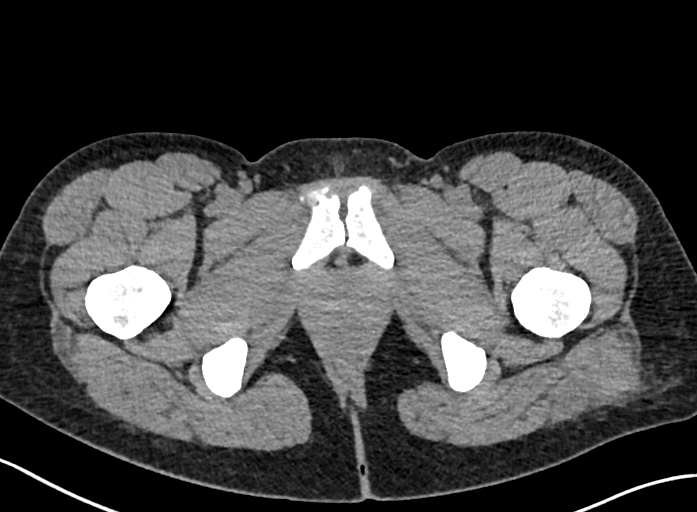
[im 15/224  bone]
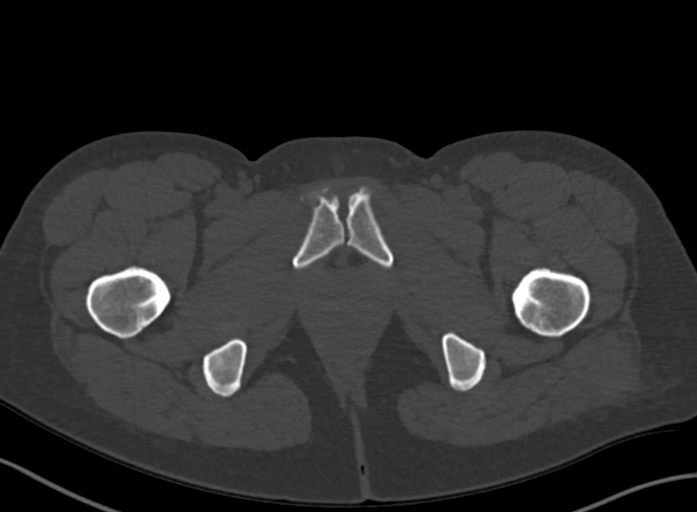
[im 36/224  soft-tissue]
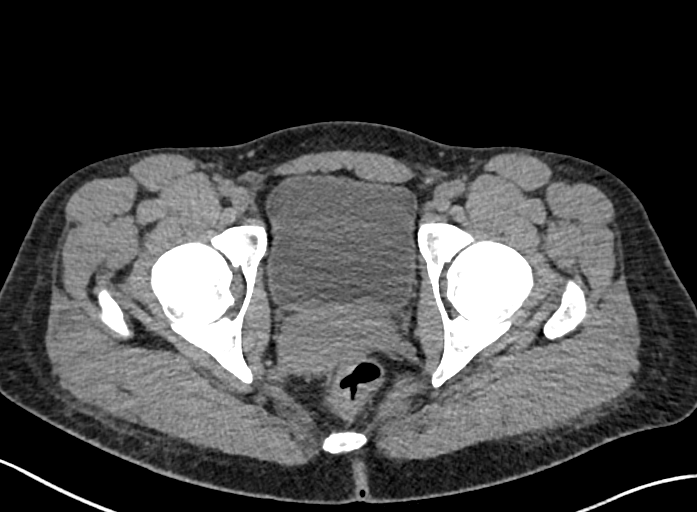
[im 58/224  soft-tissue]
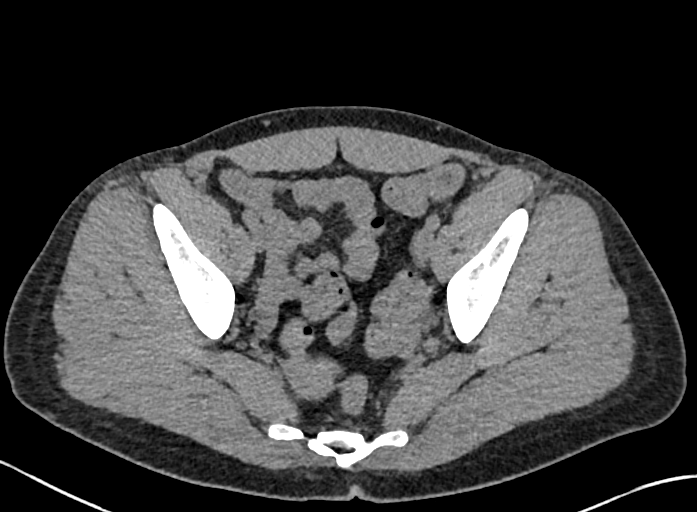
[im 80/224  soft-tissue]
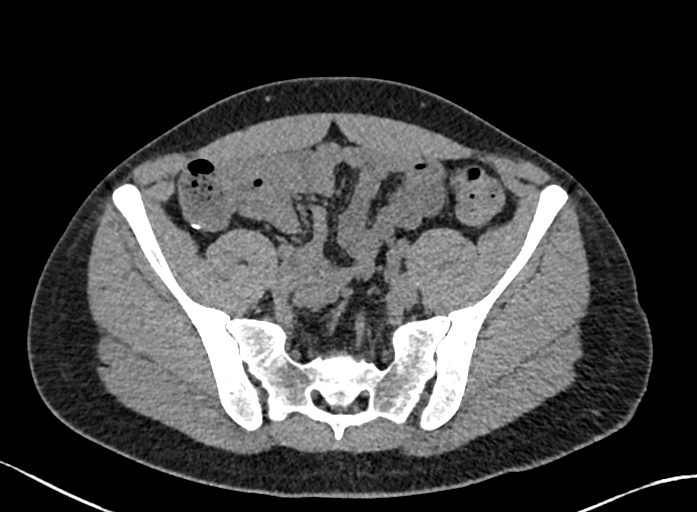
[im 101/224  soft-tissue]
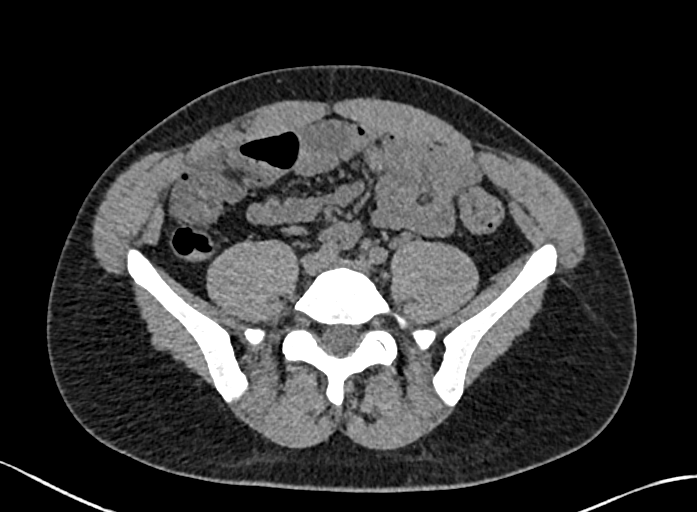
[im 123/224  soft-tissue]
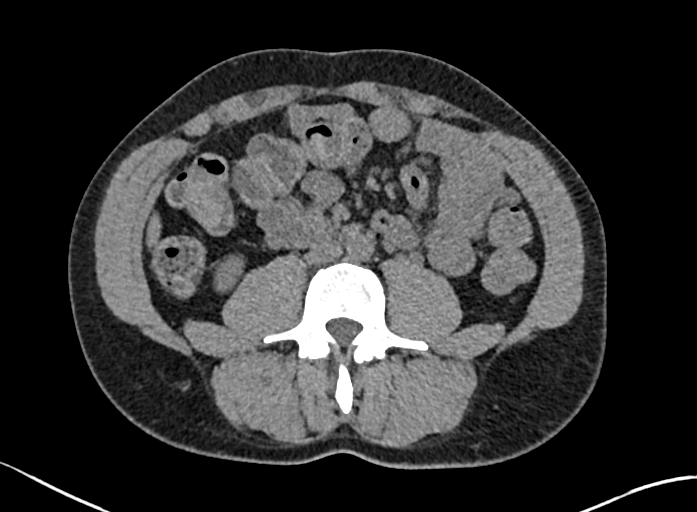
[im 144/224  soft-tissue]
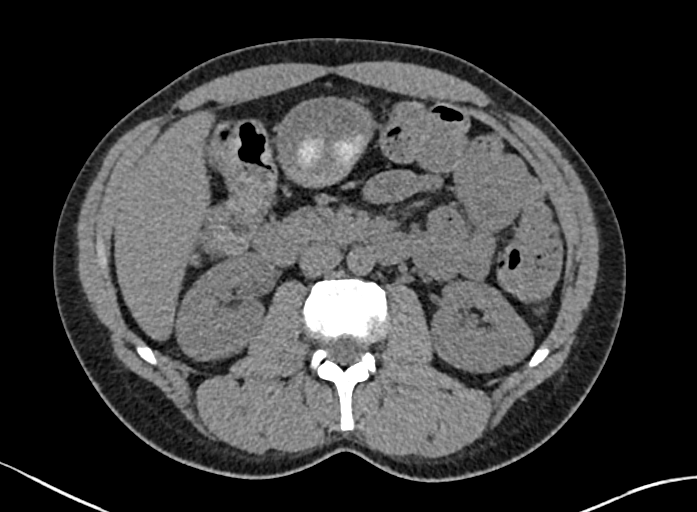
[im 166/224  soft-tissue]
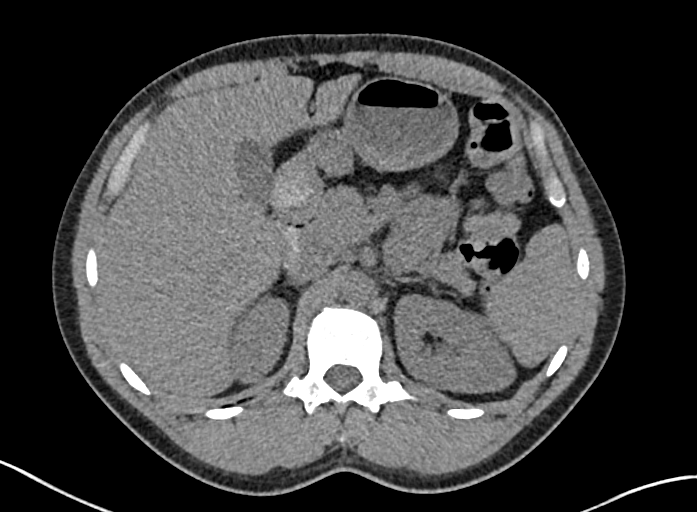
[im 188/224  soft-tissue]
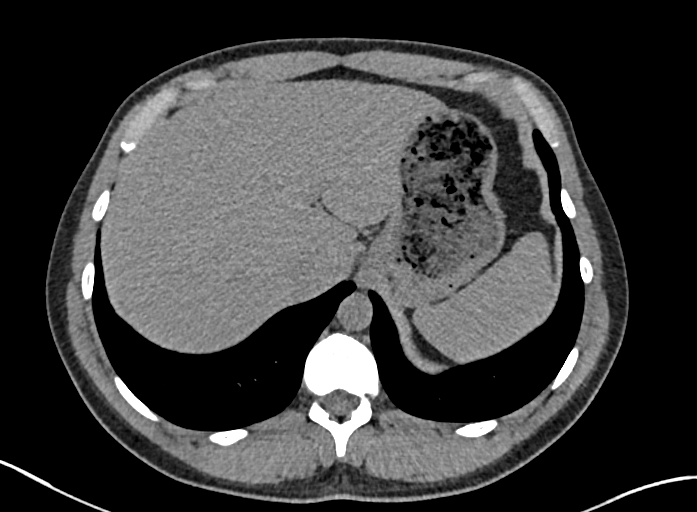
[im 188/224  bone]
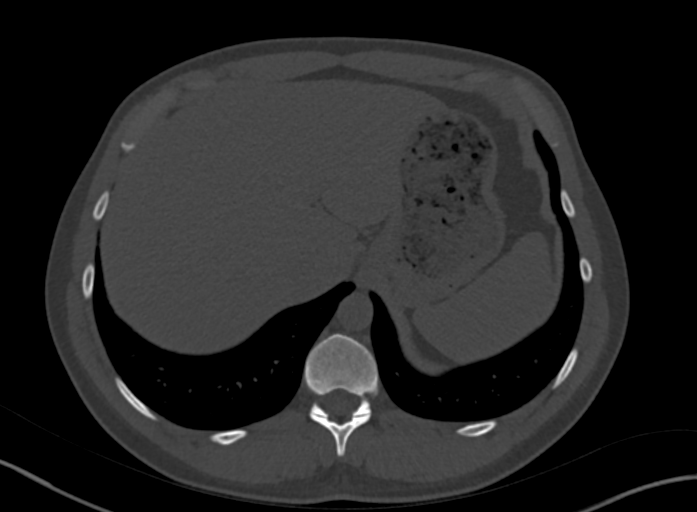
[im 209/224  soft-tissue]
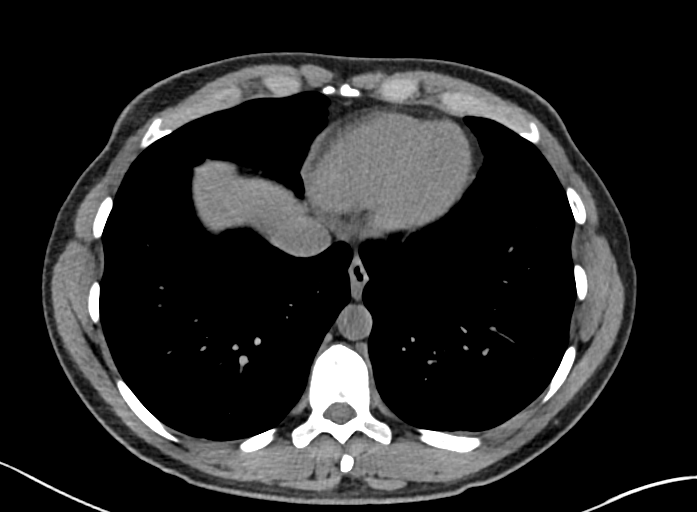

[Series 4: abdomen cor 2.00 br40 s3 · coronal · 0.75mm/px · 3 of 141 slices shown]
[im 47/141  soft-tissue]
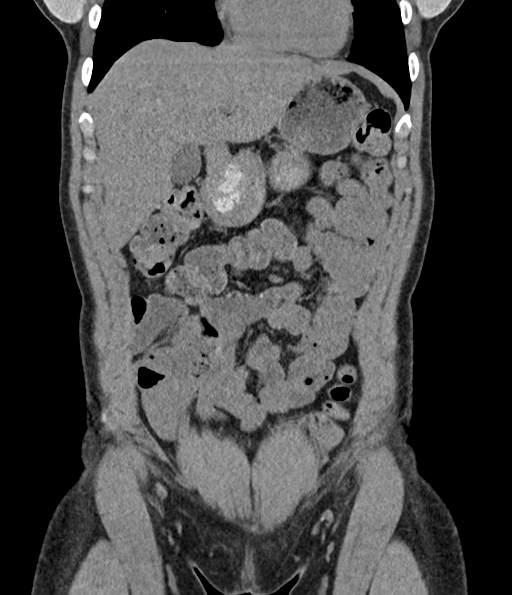
[im 63/141  soft-tissue]
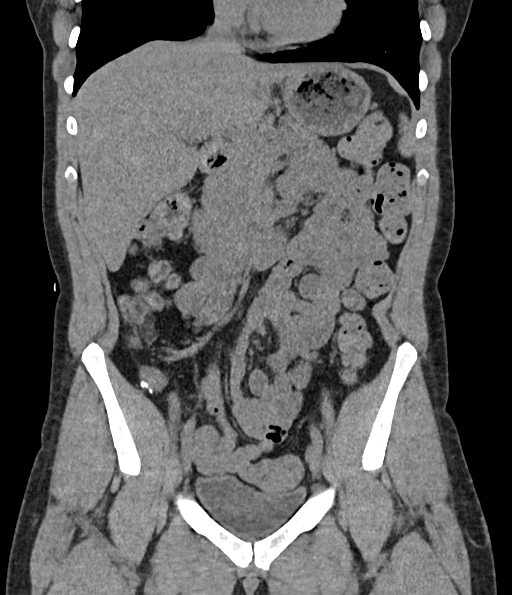
[im 78/141  soft-tissue]
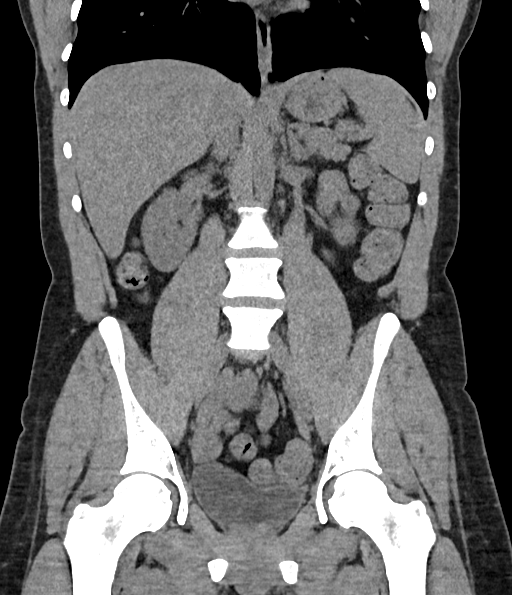

[13 of 46 positions shown; findings below may reference images not displayed]

FINDINGS: The lung bases are clear. The liver, spleen, gallbladder, pancreas, and adrenals are within
normal limits. There is no intrahepatic ductal dilatation seen. No mesenteric or
retroperitoneal adenopathy is seen. The right kidney shows a no hydronephrosis, renal
masses or calculi. The left kidney shows no hydronephrosis, renal masses or calculi. The
aorta is normal in course and caliber. No pathologically dilated loops of large or small
bowel are seen. There is no free air or free fluid seen in the abdomen. The partially
distended urinary bladder is unremarkable.
IMPRESSION: 1.No acute process
2. Nonobstructive bowel gas pattern without free air.
FINDINGS: The lung bases are clear. The liver, spleen, gallbladder, pancreas, and adrenals are within
normal limits. There is no intrahepatic ductal dilatation seen. No mesenteric or
retroperitoneal adenopathy is seen. The right kidney shows a no hydronephrosis, renal
masses or calculi. The left kidney shows no hydronephrosis, renal masses or calculi. The
aorta is normal in course and caliber. No pathologically dilated loops of large or small
bowel are seen. There is no free air or free fluid seen in the abdomen. The partially
distended urinary bladder is unremarkable.
IMPRESSION: 1.No acute process
2. Nonobstructive bowel gas pattern without free air.

Tech Notes:

PT STATES BILATERAL FLANK PAIN, HEMATURIA. CT/NM 0/0. TJ
